# Patient Record
Sex: Male | Born: 1955 | Race: White | Hispanic: No | Marital: Married
Health system: Midwestern US, Academic
[De-identification: ages and names within clinical notes are randomized; demographics above are authoritative.]

---

## 2016-12-12 ENCOUNTER — Encounter: Admit: 2016-12-12 | Discharge: 2016-12-12 | Payer: 59

## 2016-12-12 ENCOUNTER — Encounter: Admit: 2016-12-12 | Discharge: 2016-12-12 | Payer: MEDICARE

## 2016-12-12 DIAGNOSIS — D693 Immune thrombocytopenic purpura: Principal | ICD-10-CM

## 2016-12-12 DIAGNOSIS — R233 Spontaneous ecchymoses: ICD-10-CM

## 2016-12-12 DIAGNOSIS — K319 Disease of stomach and duodenum, unspecified: ICD-10-CM

## 2016-12-12 DIAGNOSIS — H547 Unspecified visual loss: ICD-10-CM

## 2016-12-12 DIAGNOSIS — E119 Type 2 diabetes mellitus without complications: ICD-10-CM

## 2016-12-12 DIAGNOSIS — M1990 Unspecified osteoarthritis, unspecified site: ICD-10-CM

## 2016-12-12 DIAGNOSIS — D699 Hemorrhagic condition, unspecified: Principal | ICD-10-CM

## 2016-12-12 DIAGNOSIS — M549 Dorsalgia, unspecified: ICD-10-CM

## 2016-12-12 DIAGNOSIS — I519 Heart disease, unspecified: ICD-10-CM

## 2016-12-12 LAB — CBC AND DIFF
Lab: 0 10*3/uL (ref 0–0.20)
Lab: 0.2 10*3/uL (ref 0–0.45)
Lab: 0.8 10*3/uL (ref 0–0.80)
Lab: 1 % (ref 0–2)
Lab: 12 % (ref 4–12)
Lab: 14 % (ref 11–15)
Lab: 14 g/dL (ref 13.5–16.5)
Lab: 2.3 10*3/uL (ref 1.0–4.8)
Lab: 230 10*3/uL (ref 150–400)
Lab: 3 % (ref 0–5)
Lab: 3.6 10*3/uL (ref 1.8–7.0)
Lab: 31 pg (ref 26–34)
Lab: 32 % (ref 24–44)
Lab: 34 g/dL (ref 32.0–36.0)
Lab: 4.5 M/UL (ref 4.4–5.5)
Lab: 42 % (ref 40–50)
Lab: 52 % (ref 41–77)
Lab: 7 10*3/uL (ref 4.5–11.0)
Lab: 8.1 FL (ref 7–11)
Lab: 93 FL (ref 80–100)

## 2016-12-12 NOTE — Progress Notes
Name: Mitchell Hood          MRN: [id number]      DOB: Dec 10, 1955      AGE: 61 y.o.   DATE OF SERVICE: 12/12/2016    Subjective:             Reason for Visit:  Heme/Onc Care    c/c - f/u of Recurrent acute idiopathic thrombocytopenic purpura    Mitchell Hood is a 61 y.o. male.     Cancer Staging  No matching staging information was found for the patient.    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  He had a good response to  treatment, but he did have side effects of edema, insomnia, mood swings and  hyperglycemia.    He had myocardial infarction and cardiomyopathy in 03/2016 and he underwent  stent placement by Dr. Waldon Merl at [facility] and he was  subsequently placed on aspirin and Brilinta.  On 05/30/2016, he started noticing  petechiae in the lower extremities, which has been gradually progressing  proximally.  He then noticed a bruise in the lower part of his abdomen, which he  attributes to his belt.  He has also had minor gum bleeding while brushing.  No  nosebleeds.  No hematemesis, melena, or hematochezia.  No hemoptysis or  hematuria.  No loss of weight or loss of appetite.  No fevers, chills or night  sweats.    He presented to the Emergency Room on 06/03/2016 with these complaints and he  was noted to have a platelet count of 5000 and he was started on Solu-Medrol 125  mg IV after discussion with the hematologist on-call, Dr. Annabell Howells.  His WBC was  10.3 with a hemoglobin of 15.4 and platelet count 5000.  CBC on 06/04/2016  revealed a platelet count of 5000.  Normal WBC and hemoglobin.    Steroid dosing was changed to prednisone 100 mg daily from 06/05/2016.    Aspirin and Brilinta was held and Cardiology was consulted for further  recommendations regarding these antiplatelet agents. Started rituximab on 06/13/2016 due to persistently low platelets despite more than a week on prednisone.  Prednisone was subsequently tapered and discontinued.  He has achieved complete remission.    Interval History: No fever chills or night sweats.  No chest pain or shortness of breath.  No nausea vomiting.    PAST MEDICAL HISTORY:  ITP approximately in 2012, gastroesophageal reflux  disease, coronary artery disease status post stent placement in 03/2016.    FAMILY HISTORY:  Sister had breast cancer.  Father had cancer around his heart  and he was a heavy smoker.    SOCIAL HISTORY:  He smokes an occasional cigar.  Drinks alcohol occasionally.         Review of Systems   Constitutional: Negative.    HENT: Negative.    Eyes: Positive for visual disturbance (broken blood vessel).   Respiratory: Negative.    Cardiovascular: Negative.    Gastrointestinal: Negative.    Genitourinary: Negative.    Musculoskeletal: Negative.    Skin: Negative.    Neurological: Negative.    Hematological: Negative.    Psychiatric/Behavioral: Negative.          Objective:         ??? aspirin  EC 81 mg tablet Take 81 mg by mouth every 7 days. Take with food.   ??? atorvastatin (LIPITOR) 80 mg tablet Take 40 mg by mouth at bedtime daily.   ??? budesonide/formoterol fumarate (SYMBICORT IN) Inhale 4 puffs by mouth into the lungs daily.   ??? CLOPIDOGREL BISULFATE (PLAVIX PO) Take  by mouth daily.   ??? furosemide (LASIX) 20 mg tablet Take 20 mg by mouth every morning.   ??? glyBURIDE (DIABETA) 2.5 mg tablet Take 2.5 mg by mouth twice daily with meals. Take with food.    ??? METFORMIN HCL (METFORMIN PO) Take 500 mg by mouth twice daily.   ??? MULTIVITAMIN PO Take  by mouth.   ??? prednisone (DELTASONE) 1 mg tablet Prednisone Taper:  Take 7.5 tablets x 3 days, then 5 tablets x 3 days, then 3 tablets x 3 days, then 2 tablets x 3 days, then 1 tablet x 3 days, THEN STOP.     There were no vitals filed for this visit. There is no height or weight on file to calculate BMI.               Pain Addressed:  N/A    Patient Evaluated for a Clinical Trial: No treatment clinical trial available for this patient.     [place] Cooperative Oncology Group performance status is 0, Fully active, able to carry on all pre-disease performance without restriction.Marland Kitchen     Physical Exam   Constitutional: He is oriented to person, place, and time. He appears well-developed and well-nourished.   HENT:   Head: Normocephalic and atraumatic.   Eyes: EOM are normal. Pupils are equal, round, and reactive to light.   Neck: Neck supple.   Cardiovascular: Normal rate and normal heart sounds.    Pulmonary/Chest: Effort normal and breath sounds normal.   Abdominal: Soft. He exhibits no mass. There is no tenderness.   Musculoskeletal: He exhibits no tenderness.   Neurological: He is alert and oriented to person, place, and time.   Skin: Skin is warm and dry. No rash noted.   Psychiatric: He has a normal mood and affect.   Vitals reviewed.            Assessment and Plan:    1.  Recurrent acute idiopathic thrombocytopenic purpura, presenting with severe  thrombocytopenia with a platelet count of only 5000 on 06/03/2016.  He has  evidence of petechiae and ecchymosis.  No severe bleeding.      He was started on Solu-Medrol 125 mg IV q. 6 hours on 06/03/2016.   Steroid dosing was changed to prednisone 100 mg daily from 06/05/2016.    Platelet counts improved to 33,000 on 06/05/2016.  Platelet counts worse at 18,000 on 06/07/2016.    Platelet counts worse at 6,000 on 06/10/2016.  Platelet counts improved to 25,000 on 06/13/2016.  Platelet counts improved to 115,000 on 06/17/2016.  Decreased prednisone to 80 mg daily.  Platelet counts improved to 116,000 on 06/27/2016.  Decreased prednisone to 60 mg daily.    Started rituximab on 06/13/2016 due to persistently low platelets despite more than a week on prednisone. He will get 3 75 mg/m??? weekly for 4 doses.  s/p W#2 Rituxan on 06/20/16 s/p W#3 Rituxan on 06/27/16.  s/p W#4 of 4 Rituxan 07/04/16    Platelet counts improved to 136,000 on 07/04/2016.  Decrease prednisone to 40 mg daily.  Platelet counts improved to 169,000 on 07/12/2016.  Decrease prednisone to 30 mg daily.  Platelets are improved to  177,000 on 07/19/16. Decrease prednisone to 20 mg daily.  Platelets are improved to 253,000 on 07/26/16. Decrease prednisone to 10 mg daily.  Platelets are improved to 260,000 on 08/01/16. Decreased prednisone to 7.5 mg daily for 3 days and then 5 mg daily 3 days, 3 mg daily for 3 days then 2 mg daily for 3 days and then 1 mg daily for 3 days and then stop..  Platelets 301 on 08/19/16  Platelets are 239 on 09/16/2016.  Platelets continue to stay normal at 230 on 12/12/2016.    Check CBC  in 12 weeks.    2.  Myocardial infarction in 03/2016.  He was on aspirin and Brilinta, which are  both on hold due to severe thrombocytopenia per Cardiology.  He will follow-up with cardiology for further recommendations as his platelets have now improved.  There are no contraindications to resume antiplatelet agents.    CBC w/DIFF  CBC with Diff Latest Ref Rng & Units 12/12/2016 09/16/2016 08/19/2016 08/01/2016 07/26/2016   WBC 4.5 - 11.0 K/UL 7.0 9.9 11.5(H) 11.5(H) 9.8   RBC 4.4 - 5.5 M/UL 4.57 4.43 4.28(L) 4.28(L) 4.34(L)   HGB 13.5 - 16.5 GM/DL 29.5 62.1 30.8 65.7 84.6   HCT 40 - 50 % 42.6 41.9 41.8 41.8 41.7   MCV 80 - 100 FL 93.3 94.7 97.7 97.5 96.1   MCH 26 - 34 PG 31.9 32.5 33.3 32.7 32.5   MCHC 32.0 - 36.0 G/DL 96.2 95.2 84.1 32.4 40.1   RDW 11 - 15 % 14.2 14.2 15.2(H) 14.9 14.5   PLT 150 - 400 K/UL 230 239 301 260 253   MPV 7 - 11 FL 8.1 8.4 8.1 7.7 7.5   NEUT 41 - 77 % 52 69 72 68 71   ANC 1.8 - 7.0 K/UL 3.60 6.70 8.30(H) 7.90(H) 7.00   LYMA 24 - 44 % 32 20(L) 17(L) 15(L) 15(L)   ALYM 1.0 - 4.8 K/UL 2.30 2.00 1.90 1.70 1.50   MONA 4 - 12 % 12 7 7 6 7    AMONO 0 - 0.80 K/UL 0.80 0.70 0.90(H) 0.70 0.60   EOSA 0 - 5 % 3 3 3  10(H) 6(H) AEOS 0 - 0.45 K/UL 0.20 0.30 0.30 1.10(H) 0.60(H)   BASA 0 - 2 % 1 1 1 1 1    ABAS 0 - 0.20 K/UL 0.00 0.10 0.10 0.10 0.10

## 2017-03-13 ENCOUNTER — Encounter: Admit: 2017-03-13 | Discharge: 2017-03-13 | Payer: MEDICARE

## 2017-03-13 ENCOUNTER — Encounter: Admit: 2017-03-13 | Discharge: 2017-03-13 | Payer: 59

## 2017-03-13 DIAGNOSIS — K319 Disease of stomach and duodenum, unspecified: ICD-10-CM

## 2017-03-13 DIAGNOSIS — D693 Immune thrombocytopenic purpura: Principal | ICD-10-CM

## 2017-03-13 DIAGNOSIS — F17290 Nicotine dependence, other tobacco product, uncomplicated: ICD-10-CM

## 2017-03-13 DIAGNOSIS — I252 Old myocardial infarction: ICD-10-CM

## 2017-03-13 DIAGNOSIS — M1990 Unspecified osteoarthritis, unspecified site: ICD-10-CM

## 2017-03-13 DIAGNOSIS — H547 Unspecified visual loss: ICD-10-CM

## 2017-03-13 DIAGNOSIS — M549 Dorsalgia, unspecified: ICD-10-CM

## 2017-03-13 DIAGNOSIS — D699 Hemorrhagic condition, unspecified: Principal | ICD-10-CM

## 2017-03-13 DIAGNOSIS — E119 Type 2 diabetes mellitus without complications: ICD-10-CM

## 2017-03-13 DIAGNOSIS — I519 Heart disease, unspecified: ICD-10-CM

## 2017-03-13 LAB — CBC AND DIFF
Lab: 0.1 10*3/uL (ref 0–0.20)
Lab: 0.3 10*3/uL (ref 0–0.45)
Lab: 0.7 10*3/uL (ref 0–0.80)
Lab: 13 % (ref 11–15)
Lab: 15 g/dL (ref 13.5–16.5)
Lab: 2.2 10*3/uL (ref 1.0–4.8)
Lab: 220 10*3/uL (ref 150–400)
Lab: 31 pg (ref 26–34)
Lab: 32 g/dL (ref 32.0–36.0)
Lab: 4.8 M/UL (ref 4.4–5.5)
Lab: 47 % (ref 40–50)
Lab: 57 % (ref 41–77)
Lab: 7.8 10*3/uL (ref 4.5–11.0)
Lab: 9.1 FL (ref 7–11)
Lab: 97 FL (ref 80–100)

## 2017-03-13 NOTE — Progress Notes
Name: Mitchell Hood          MRN: [id number]      DOB: January 31, 1956      AGE: 61 y.o.   DATE OF SERVICE: 03/13/2017    Subjective:             Reason for Visit:  Heme/Onc Care    c/c - f/u of Recurrent acute idiopathic thrombocytopenic purpura    Mitchell Hood is a 61 y.o. male.     Cancer Staging  No matching staging information was found for the patient.    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  He had a good response to  treatment, but he did have side effects of edema, insomnia, mood swings and  hyperglycemia.    He had myocardial infarction and cardiomyopathy in 03/2016 and he underwent  stent placement by Dr. Waldon Merl at [facility] and he was  subsequently placed on aspirin and Brilinta.  On 05/30/2016, he started noticing  petechiae in the lower extremities, which has been gradually progressing  proximally.  He then noticed a bruise in the lower part of his abdomen, which he  attributes to his belt.  He has also had minor gum bleeding while brushing.  No  nosebleeds.  No hematemesis, melena, or hematochezia.  No hemoptysis or  hematuria.  No loss of weight or loss of appetite.  No fevers, chills or night  sweats.    He presented to the Emergency Room on 06/03/2016 with these complaints and he  was noted to have a platelet count of 5000 and he was started on Solu-Medrol 125  mg IV after discussion with the hematologist on-call, Dr. Annabell Howells.  His WBC was  10.3 with a hemoglobin of 15.4 and platelet count 5000.  CBC on 06/04/2016  revealed a platelet count of 5000.  Normal WBC and hemoglobin.    Steroid dosing was changed to prednisone 100 mg daily from 06/05/2016.    Aspirin and Brilinta was held and Cardiology was consulted for further  recommendations regarding these antiplatelet agents. Started rituximab on 06/13/2016 due to persistently low platelets despite more than a week on prednisone.  Prednisone was subsequently tapered and discontinued.  He has achieved complete remission.    Interval History: No fever chills or night sweats.  No chest pain or shortness of breath.  No nausea vomiting.  no bleeding.    PAST MEDICAL HISTORY:  ITP approximately in 2012, gastroesophageal reflux  disease, coronary artery disease status post stent placement in 03/2016.    FAMILY HISTORY:  Sister had breast cancer.  Father had cancer around his heart  and he was a heavy smoker.    SOCIAL HISTORY:  He smokes an occasional cigar.  Drinks alcohol occasionally.           Review of Systems   Constitutional: Negative.    HENT: Negative.    Eyes: Negative.    Respiratory: Negative.    Cardiovascular: Negative.    Gastrointestinal: Negative.    Genitourinary: Negative.    Musculoskeletal: Negative.    Skin: Negative.    Neurological: Positive for dizziness.   Hematological: Negative.    Psychiatric/Behavioral: Negative.          Objective:         ???  aspirin EC 81 mg tablet Take 81 mg by mouth every 7 days. Take with food.   ??? atorvastatin (LIPITOR) 80 mg tablet Take 40 mg by mouth at bedtime daily.   ??? budesonide/formoterol fumarate (SYMBICORT IN) Inhale 4 puffs by mouth into the lungs daily.   ??? CLOPIDOGREL BISULFATE (PLAVIX PO) Take  by mouth daily.   ??? furosemide (LASIX) 20 mg tablet Take 20 mg by mouth every morning.   ??? glyBURIDE (DIABETA) 2.5 mg tablet Take 2.5 mg by mouth twice daily with meals. Take with food.    ??? METFORMIN HCL (METFORMIN PO) Take 500 mg by mouth twice daily.   ??? MULTIVITAMIN PO Take  by mouth.     Vitals:    03/13/17 1452   Height: 160 cm (62.99)     Body mass index is 39.48 kg/m???.               Pain Addressed:  N/A    Patient Evaluated for a Clinical Trial: No treatment clinical trial available for this patient.     [place] Cooperative Oncology Group performance status is 0, Fully active, able to carry on all pre-disease performance without restriction.Marland Kitchen     Physical Exam   Constitutional: He is oriented to person, place, and time. He appears well-developed and well-nourished.   HENT:   Head: Normocephalic and atraumatic.   Eyes: Pupils are equal, round, and reactive to light. EOM are normal.   Neck: Neck supple.   Cardiovascular: Normal rate and normal heart sounds.    Pulmonary/Chest: Effort normal and breath sounds normal.   Abdominal: Soft. He exhibits no mass. There is no tenderness.   Musculoskeletal: He exhibits no tenderness.   Neurological: He is alert and oriented to person, place, and time.   Skin: Skin is warm and dry. No rash noted.   Psychiatric: He has a normal mood and affect.   Vitals reviewed.            Assessment and Plan:      1.  Recurrent acute idiopathic thrombocytopenic purpura, presenting with severe  thrombocytopenia with a platelet count of only 5000 on 06/03/2016.  He has  evidence of petechiae and ecchymosis.  No severe bleeding.      He was started on Solu-Medrol 125 mg IV q. 6 hours on 06/03/2016.   Steroid dosing was changed to prednisone 100 mg daily from 06/05/2016.    Platelet counts improved to 33,000 on 06/05/2016.  Platelet counts worse at 18,000 on 06/07/2016.    Platelet counts worse at 6,000 on 06/10/2016.  Platelet counts improved to 25,000 on 06/13/2016.  Platelet counts improved to 115,000 on 06/17/2016.  Decreased prednisone to 80 mg daily.  Platelet counts improved to 116,000 on 06/27/2016.  Decreased prednisone to 60 mg daily.    Started rituximab on 06/13/2016 due to persistently low platelets despite more than a week on prednisone. He will get 3 75 mg/m??? weekly for 4 doses.  s/p W#2 Rituxan on 06/20/16  s/p W#3 Rituxan on 06/27/16.  s/p W#4 of 4 Rituxan 07/04/16    Platelet counts improved to 136,000 on 07/04/2016.  Decrease prednisone to 40 mg daily.  Platelet counts improved to 169,000 on 07/12/2016.  Decrease prednisone to 30 mg daily. Platelets are improved to 177,000 on 07/19/16. Decrease prednisone to 20 mg daily.  Platelets are improved to 253,000 on 07/26/16. Decrease prednisone to 10 mg daily.  Platelets are improved to 260,000 on 08/01/16. Decreased prednisone to 7.5 mg daily  for 3 days and then 5 mg daily 3 days, 3 mg daily for 3 days then 2 mg daily for 3 days and then 1 mg daily for 3 days and then stop..  Platelets 301 on 08/19/16  Platelets are 239 on 09/16/2016.  Platelets continue to stay normal at 230 on 12/12/2016.  Platelet count is normal at 220 on 03/13/2017.    Plan follow-up CBC in 3 months.    2.  Myocardial infarction in 03/2016.  He was on aspirin and Brilinta, which are  both on hold due to severe thrombocytopenia per Cardiology.  He will follow-up with cardiology for further recommendations as his platelets have now improved.  There are no contraindications to resume antiplatelet agents.    CBC w/DIFF  CBC with Diff Latest Ref Rng & Units 03/13/2017 12/12/2016 09/16/2016 08/19/2016 08/01/2016   WBC 4.5 - 11.0 K/UL 7.8 7.0 9.9 11.5(H) 11.5(H)   RBC 4.4 - 5.5 M/UL 4.87 4.57 4.43 4.28(L) 4.28(L)   HGB 13.5 - 16.5 GM/DL 95.6 21.3 08.6 57.8 46.9   HCT 40 - 50 % 47.4 42.6 41.9 41.8 41.8   MCV 80 - 100 FL 97.4 93.3 94.7 97.7 97.5   MCH 26 - 34 PG 31.9 31.9 32.5 33.3 32.7   MCHC 32.0 - 36.0 G/DL 62.9 52.8 41.3 24.4 01.0   RDW 11 - 15 % 13.5 14.2 14.2 15.2(H) 14.9   PLT 150 - 400 K/UL 220 230 239 301 260   MPV 7 - 11 FL 9.1 8.1 8.4 8.1 7.7   NEUT 41 - 77 % 57 52 69 72 68   ANC 1.8 - 7.0 K/UL 4.50 3.60 6.70 8.30(H) 7.90(H)   LYMA 24 - 44 % 29 32 20(L) 17(L) 15(L)   ALYM 1.0 - 4.8 K/UL 2.20 2.30 2.00 1.90 1.70   MONA 4 - 12 % 9 12 7 7 6    AMONO 0 - 0.80 K/UL 0.70 0.80 0.70 0.90(H) 0.70   EOSA 0 - 5 % 4 3 3 3  10(H)   AEOS 0 - 0.45 K/UL 0.30 0.20 0.30 0.30 1.10(H)   BASA 0 - 2 % 1 1 1 1 1    ABAS 0 - 0.20 K/UL 0.10 0.00 0.10 0.10 0.10

## 2017-06-13 ENCOUNTER — Encounter: Admit: 2017-06-13 | Discharge: 2017-06-13 | Payer: MEDICARE

## 2018-07-23 LAB — SED RATE: Lab: 2

## 2018-07-23 LAB — HEMOGLOBIN A1C: Lab: 6.2 — ABNORMAL HIGH (ref <5.7)

## 2018-07-23 LAB — MAGNESIUM: Lab: 1.6

## 2018-07-23 LAB — COMPREHENSIVE METABOLIC PANEL
Lab: 140
Lab: 23
Lab: 4.2
Lab: 43
Lab: 85
Lab: 88
Lab: 99

## 2018-07-23 LAB — LIPID PROFILE
Lab: 103
Lab: 3.4
Lab: 30 — ABNORMAL LOW (ref >40)
Lab: 43 — ABNORMAL HIGH (ref 65–99)
Lab: 73 — ABNORMAL LOW (ref 6.1–8.1)

## 2018-07-23 LAB — CBC: Lab: 8.4

## 2018-07-23 LAB — 25-OH VITAMIN D (D2 + D3): Lab: 34

## 2018-07-23 LAB — THYROID STIMULATING HORMONE-TSH: Lab: 2.7

## 2018-09-23 ENCOUNTER — Encounter: Admit: 2018-09-23 | Discharge: 2018-09-23 | Payer: MEDICARE

## 2018-09-23 DIAGNOSIS — M549 Dorsalgia, unspecified: ICD-10-CM

## 2018-09-23 DIAGNOSIS — H547 Unspecified visual loss: ICD-10-CM

## 2018-09-23 DIAGNOSIS — D699 Hemorrhagic condition, unspecified: Principal | ICD-10-CM

## 2018-09-23 DIAGNOSIS — M1990 Unspecified osteoarthritis, unspecified site: ICD-10-CM

## 2018-09-23 DIAGNOSIS — K319 Disease of stomach and duodenum, unspecified: ICD-10-CM

## 2018-09-23 DIAGNOSIS — I519 Heart disease, unspecified: ICD-10-CM

## 2018-09-23 DIAGNOSIS — E119 Type 2 diabetes mellitus without complications: ICD-10-CM

## 2018-09-23 NOTE — Telephone Encounter
Records Received from [facility]  have been scanned to chart and available in the [facility] button.  Thanks,  Ascencion Dike  [facility] Medical Records Specialist

## 2018-09-24 ENCOUNTER — Encounter: Admit: 2018-09-24 | Discharge: 2018-09-24 | Payer: MEDICARE

## 2018-09-24 ENCOUNTER — Ambulatory Visit: Admit: 2018-09-24 | Discharge: 2018-09-25

## 2018-09-24 DIAGNOSIS — K319 Disease of stomach and duodenum, unspecified: ICD-10-CM

## 2018-09-24 DIAGNOSIS — I519 Heart disease, unspecified: ICD-10-CM

## 2018-09-24 DIAGNOSIS — K219 Gastro-esophageal reflux disease without esophagitis: ICD-10-CM

## 2018-09-24 DIAGNOSIS — M1990 Unspecified osteoarthritis, unspecified site: ICD-10-CM

## 2018-09-24 DIAGNOSIS — H547 Unspecified visual loss: ICD-10-CM

## 2018-09-24 DIAGNOSIS — E785 Hyperlipidemia, unspecified: ICD-10-CM

## 2018-09-24 DIAGNOSIS — R002 Palpitations: ICD-10-CM

## 2018-09-24 DIAGNOSIS — E1169 Type 2 diabetes mellitus with other specified complication: ICD-10-CM

## 2018-09-24 DIAGNOSIS — E119 Type 2 diabetes mellitus without complications: ICD-10-CM

## 2018-09-24 DIAGNOSIS — D693 Immune thrombocytopenic purpura: ICD-10-CM

## 2018-09-24 DIAGNOSIS — I2510 Atherosclerotic heart disease of native coronary artery without angina pectoris: Principal | ICD-10-CM

## 2018-09-24 DIAGNOSIS — D699 Hemorrhagic condition, unspecified: Principal | ICD-10-CM

## 2018-09-24 DIAGNOSIS — E782 Mixed hyperlipidemia: ICD-10-CM

## 2018-09-24 DIAGNOSIS — M549 Dorsalgia, unspecified: ICD-10-CM

## 2018-09-24 NOTE — Assessment & Plan Note
He denies any recent bleeding complications.  He has remained off aspirin and takes Plavix for his coronary disease.  It may be a good idea for [place] to make arrangements for him to see a hematologist because in the event he requires any sort of coronary intervention we would want to try to get him back on aspirin if at all possible.

## 2018-09-24 NOTE — Telephone Encounter
Echo ordered per [facility]. No auth needed Ref # [id number]. Order faxed to [facility] scheduling.  Scheduling will call pt to schedule. Pt aware.

## 2018-09-24 NOTE — Progress Notes
Weight: 103.4 kg (228 lb)   Height: 1.6 m (5' 3)   PainSc: Zero     Body mass index is 40.39 kg/m???.     Past Medical History  Patient Active Problem List    Diagnosis Date Noted   ??? Coronary artery disease involving native heart 09/24/2018     03/2016 s/p MI, Proximal LAD stent placed at [facility]  07/23/2016  Echo [place]: Left ventricle is normal size.  Left ventricle systolic function is mildly decreased.  Est EF is 40%.  There is mild hypokinesis in the mid and apical anterior, distal septal walls and apex.  There is no significant aortic valvular stenosis.  No significant aortic regurgitation.  Trace mitral regurgitation.  Tricuspid regurgitation.  PA pressure was est at 22 mmHg.      ??? Type 2 diabetes mellitus (HCC) 09/24/2018   ??? GERD (gastroesophageal reflux disease) 09/24/2018   ??? Hyperlipidemia 09/24/2018   ??? Palpitations 09/24/2018   ??? Acute idiopathic thrombocytopenic purpura (HCC) 06/07/2016     08/08/2016 CT angiogram: No PE. No evidence of arteriovenous malformation the lungs. Couple of small consolidative opacities in the subpleural right lower lobe likely focal atelectasis.  If patient is high risk of lung malignancy, follow-up CT chest recommended in 4-6 months.             Review of Systems   Constitution: Negative.   HENT: Negative.    Eyes: Negative.    Cardiovascular: Positive for dyspnea on exertion and irregular heartbeat.   Respiratory: Positive for shortness of breath.    Endocrine: Negative.    Hematologic/Lymphatic: Negative.    Skin: Negative.    Musculoskeletal: Negative.    Gastrointestinal: Negative.    Genitourinary: Negative.    Neurological: Negative.    Psychiatric/Behavioral: Negative.    Allergic/Immunologic: Negative.        Physical Exam    Physical Exam   General Appearance: no distress   Skin: warm, no ulcers or xanthomas   Digits and Nails: no cyanosis or clubbing   Eyes: conjunctivae and lids normal, pupils are equal and round Teeth/Gums/Palate: dentition unremarkable, no lesions   Lips & Oral Mucosa: no pallor or cyanosis   Neck Veins: normal JVP , neck veins are not distended   Thyroid: no nodules, masses, tenderness or enlargement   Chest Inspection: chest is normal in appearance   Respiratory Effort: breathing comfortably, no respiratory distress   Auscultation/Percussion: lungs clear to auscultation, no rales or rhonchi, no wheezing   PMI: PMI not enlarged or displaced   Cardiac Rhythm: regular rhythm and normal rate   Cardiac Auscultation: S1, S2 normal, no rub, no gallop   Murmurs: no murmur   Peripheral Circulation: normal peripheral circulation   Carotid Arteries: normal carotid upstroke bilaterally, no bruits   Radial Arteries: normal symmetric radial pulses   Abdominal Aorta: no abdominal aortic bruit   Pedal Pulses: normal symmetric pedal pulses   Lower Extremity Edema: no lower extremity edema   Abdominal Exam: soft, non-tender, no masses, bowel sounds normal   Liver & Spleen: no organomegaly   Gait & Station: walks without assistance   Muscle Strength: normal muscle tone   Orientation: oriented to time, place and person   Affect & Mood: appropriate and sustained affect   Language and Memory: patient responsive and seems to comprehend information   Neurologic Exam: neurological assessment grossly intact   Other: moves all extremities      Cardiovascular Studies    EKG:  Sinus rhythm, rate 71.  LAFB.  Old anteroseptal MI.    Problems Addressed Today  Encounter Diagnoses   Name Primary?   ??? Coronary artery disease due to calcified coronary lesion Yes   ??? Palpitations    ??? Coronary artery disease involving native coronary artery of native heart without angina pectoris    ??? Type 2 diabetes mellitus with other specified complication, without long-term current use of insulin (HCC)    ??? Acute idiopathic thrombocytopenic purpura (HCC)    ??? Mixed hyperlipidemia        Assessment and Plan         Type 2 diabetes mellitus (HCC)

## 2018-09-24 NOTE — Telephone Encounter
09/24/18 records received from [facility], Records are available in pt chart through [facility]

## 2018-09-24 NOTE — Assessment & Plan Note
I am really concerned about his diabetes and suggested that it might be a good idea for him to see an endocrinologist.  He is trying to lose weight but he remains significantly overweight he is trying to lose weight but he remains significantly overweight and I think were going to need to be a little more aggressive with his medical management, as the diabetes is his primary, uncontrolled risk factor.

## 2018-09-28 ENCOUNTER — Encounter: Admit: 2018-09-28 | Discharge: 2018-09-28 | Payer: MEDICARE

## 2018-09-28 ENCOUNTER — Ambulatory Visit: Admit: 2018-09-28 | Discharge: 2018-09-29 | Payer: MEDICARE

## 2018-09-28 DIAGNOSIS — I2510 Atherosclerotic heart disease of native coronary artery without angina pectoris: Principal | ICD-10-CM

## 2018-09-28 IMAGING — US ECHOCOMPL
1 series · 13 of 24 positions shown · non-contrast
Comparison: none

[Series 1: us echo 2d, wo/w m-mode, compl · 86 acquisitions, 13 frames shown]
[im 1/86]
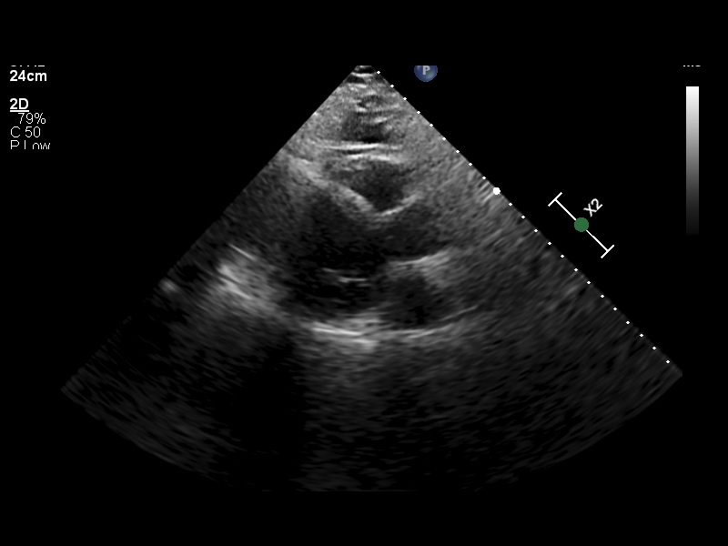
[im 8/86]
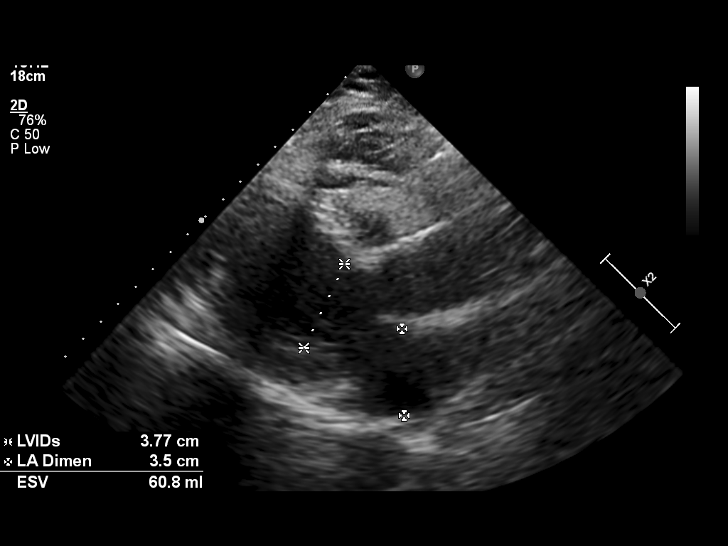
[im 19/86]
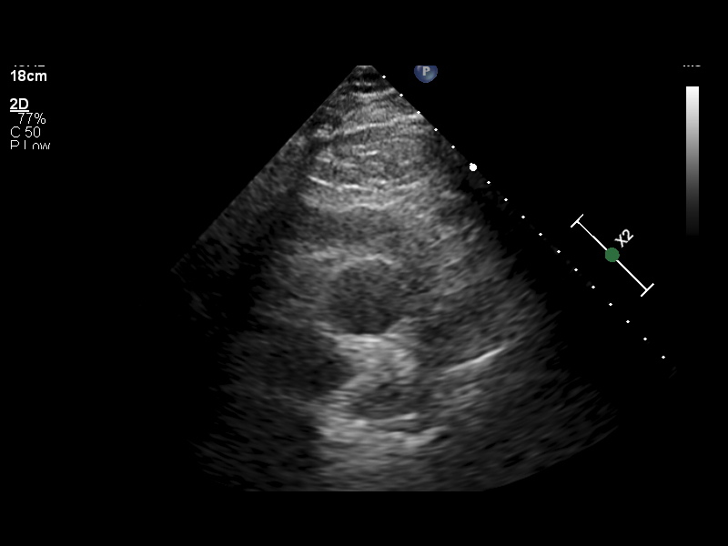
[im 23/86]
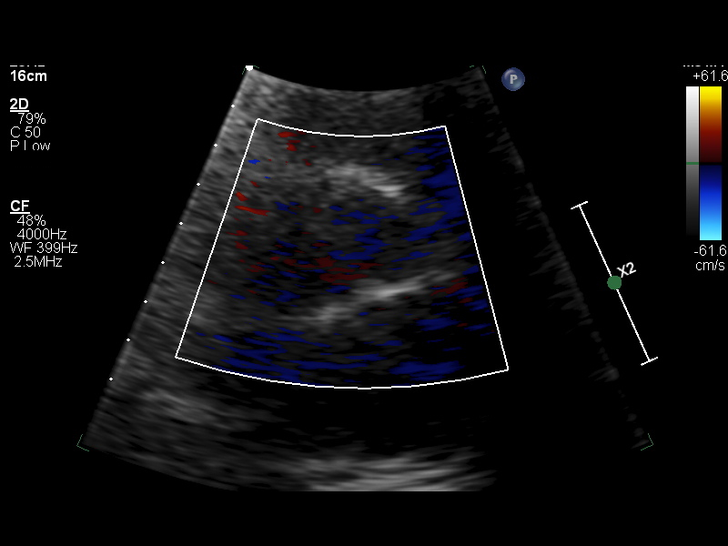
[im 30/86]
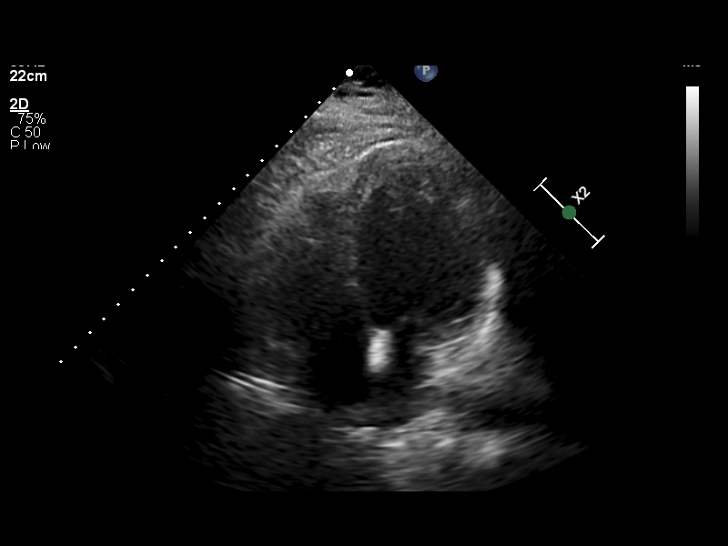
[im 34/86]
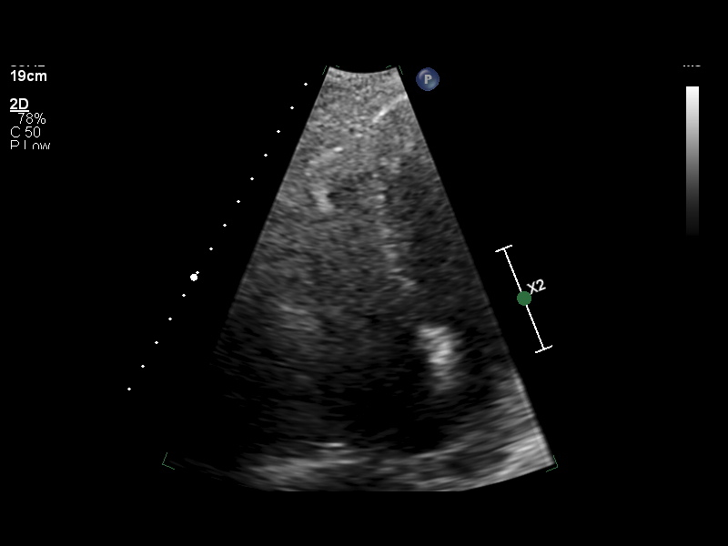
[im 41/86]
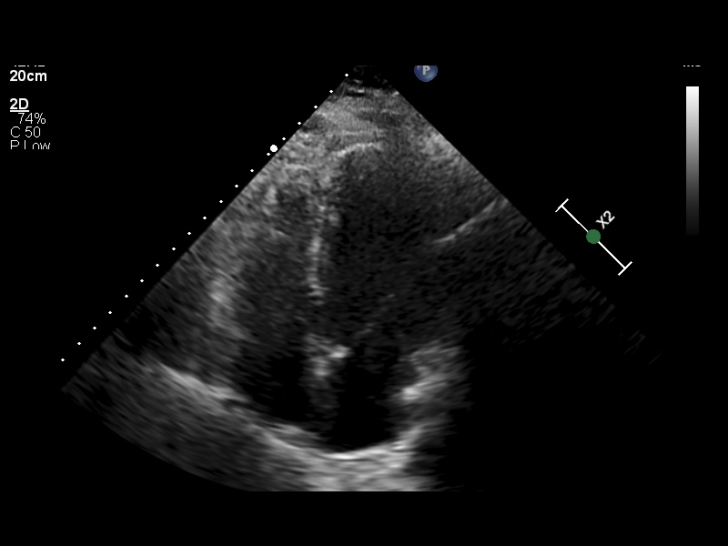
[im 49/86]
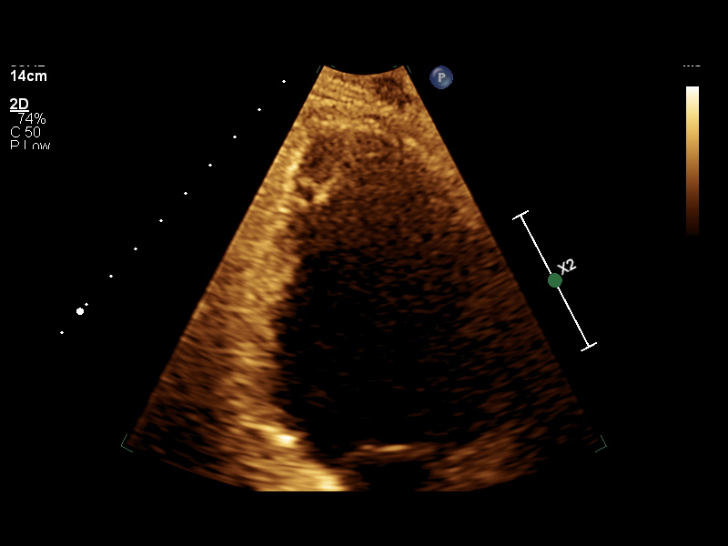
[im 56/86]
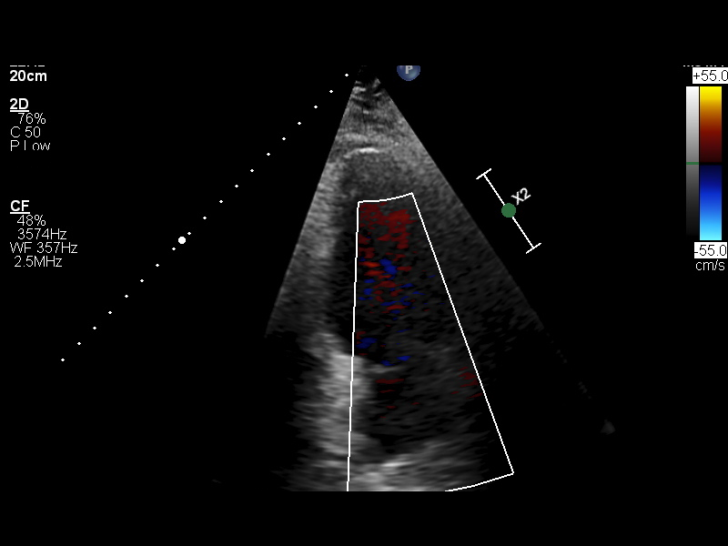
[im 60/86]
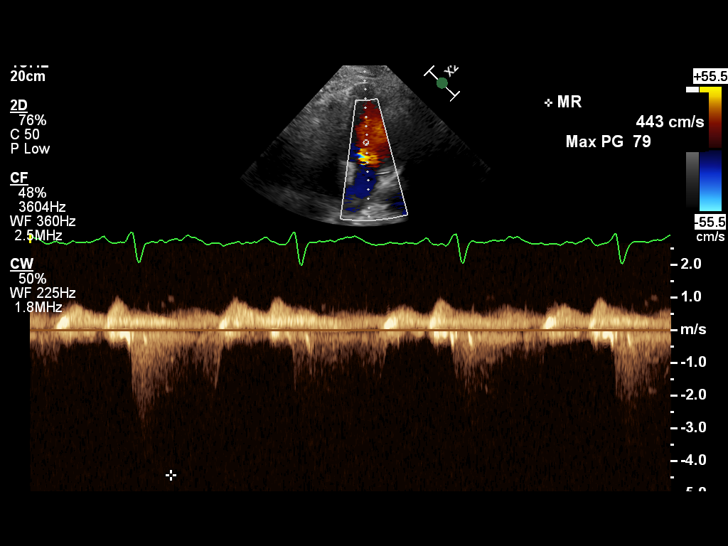
[im 71/86]
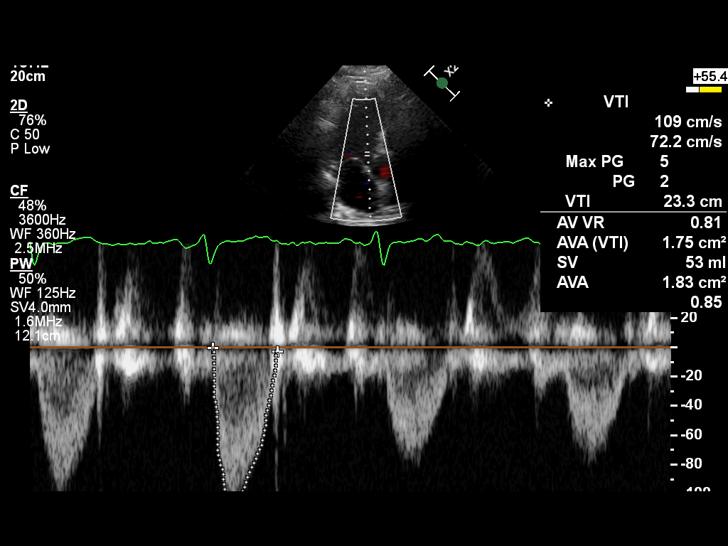
[im 78/86]
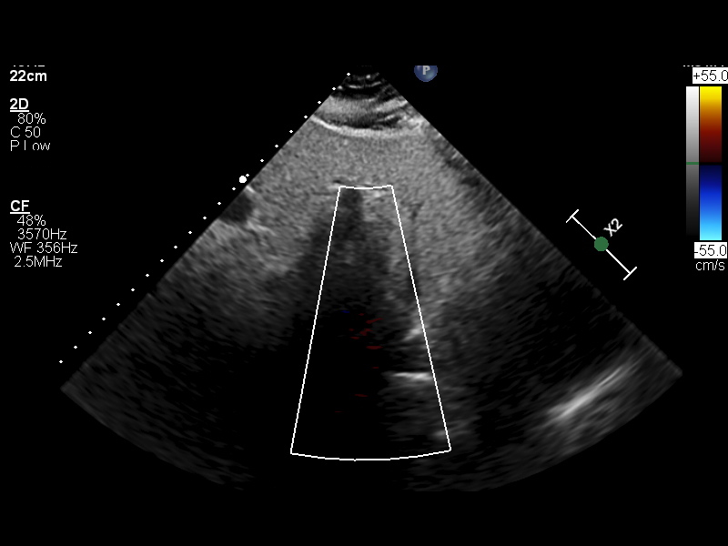
[im 86/86]
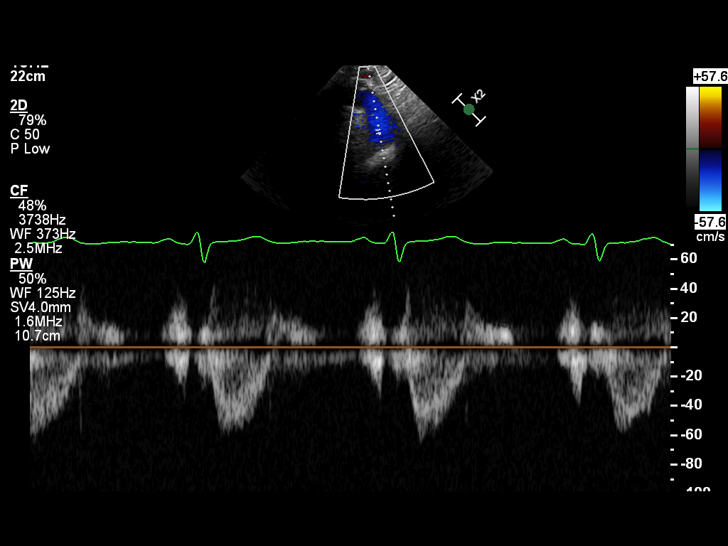

[13 of 24 positions shown; findings below may reference images not displayed]

[REDACTED]D + DOPPLER ECHOCARDIOGRAM
Location Performed: [HOSPITAL]

Referring Provider:
Fellow:
Location of Interp:

Indications: Coronary artery disease

Technically difficult study due to patient's body habitus.

Vitals
Height   Weight   BSA (Calculated)   BP   Comments
160 cm (63")   104 kg (229 lb 4.5 oz)   2.15   145/72

Interpretation Summary
Left Ventricle: Normal size and wall thickness. Moderately decreased ejection fraction with
LVEF=35%. Segmental wall motion, as coded in the diagram below.
Right Ventricle: Normal size and ejection fraction.
There is no significant valve disease.
No pericardial effusion.
No prior for comparison.

Echocardiographic Findings
Left Ventricle   Normal size and wall thickness. Moderately decreased ejection fraction.
Right Ventricle   Normal size and ejection fraction.
Left Atrium   Normal size.
Right Atrium   Normal size.
IVC/SVC   Normal central venous pressure (0-5 mm Hg).
Mitral Valve   Normal valve structure. Mild regurgitation.
Tricuspid Valve   The tricuspid valve was not well seen. Trace regurgitation.
Aortic Valve   Normal valve structure. No stenosis. No regurgitation.
Pulmonary   The pulmonic valve was not seen well but no Doppler evidence of stenosis. Trace
regurgitation.
Aorta   The aortic root and ascending aorta are normal in size.
Pericardium   No pericardial effusion.

Left Ventricular Wall Scoring
Resting   Score Index: 1.765   Percent Normal: 58.8%

The following segments are akinetic: mid anterior, apical anterior, apical septal, apical inferior,
apical lateral and apex.
The following segments are hypokinetic: mid anterolateral.
All other segments are normal.

Left Heart 2D Measurements (Normal Ranges)
EF
35 %
LVIDD
5.1 cm (Range: 4.2 - 5.8)
LVIDS
3.8 cm (Range: 2.5 - 4.0)
IVS
0.8 cm (Range: 0.6 - 1.0)
LV PW
1.0 cm (Range: 0.6 - 1.0)
LA Size
3.5 cm (Range: 3.0 - 4.0)

Right Heart 2D   M-Mode Measurements (Normal Ranges) (Range)
RV Basal Dia
4.1 cm (2.5 - 4.1)
RV Mid Dia
3.6 cm (1.9 - 3.5)
BRACK TIGER
14.0 cm2 (<18)
M-Mode TAPSE
2.5 cm (>1.7)

Left Heart 2D Addnl Measurements (Normal Ranges)
LA Vol
43.9 mL (Range: 18 - 58)
LA Vol Index
20.42 (Range: 16 - 34)
LV Mass
163.55 g (Range: 88 - 224)
LV Mass Index
76.07 g/m2 (Range: 49 - 115)
RWT
0.39 (Range: <=0.42)

Aortic Root Measurements (Normal Ranges)
Sinus
2.5 cm (Range: 2.8 - 4.0)

Doppler (Spectral and Color Flow)
Estimated Peak Systolic PA Pressure
N/A mmHg
Aortic valve peak velocity
1.4 m/s

Tech Notes:

jl

## 2018-09-29 ENCOUNTER — Ambulatory Visit: Admit: 2018-09-29 | Discharge: 2018-09-30 | Payer: MEDICARE

## 2018-09-29 ENCOUNTER — Encounter: Admit: 2018-09-29 | Discharge: 2018-09-29 | Payer: MEDICARE

## 2018-09-29 DIAGNOSIS — R002 Palpitations: Principal | ICD-10-CM

## 2018-09-29 NOTE — Progress Notes
Online enrollment confirmed with [employer]

## 2018-10-06 ENCOUNTER — Encounter: Admit: 2018-10-06 | Discharge: 2018-10-06 | Payer: MEDICARE

## 2018-10-06 NOTE — Telephone Encounter
-----   Message from Vanice Sarah, MD sent at 09/29/2018  6:49 AM CDT -----  So his EF isn't a lot different than his last echo at [facility].  We'll see what his cardiac monitor shows regarding his palpitation symptoms.  I had told him that we'd set up a stress test in 6 months, but maybe we should go ahead and schedule a regadenoson thallium for him after the monitor comes off, if he's OK with that.  [facility], can you please check with him about this?  Thanks!    Cc:  DR. Erskine Emery

## 2018-10-19 ENCOUNTER — Encounter: Admit: 2018-10-19 | Discharge: 2018-10-19

## 2018-10-19 DIAGNOSIS — R002 Palpitations: Secondary | ICD-10-CM

## 2018-10-19 DIAGNOSIS — I2510 Atherosclerotic heart disease of native coronary artery without angina pectoris: Secondary | ICD-10-CM

## 2018-10-19 NOTE — Telephone Encounter
Left msg for pt at preferred # re: test results and to call back to schedule thallium at pt's earliest convenience. Left sched # for call back. Order entered for regadenoson thallium stress test as per MD request below. No further needs identified at this time.      ----- Message -----  From: Michiel Cowboy, MD  Sent: 10/19/2018   2:22 PM CDT  To: [facility]    [facility], his monitor wasn't remarkable at all.  Can you let him know and ask him if he'd be willing to schedule a regadenoson thallium stress sooner than the 6 months I talked about during his recent office visit?  Thanks.
